# Patient Record
Sex: Female | Born: 1955 | Race: White | Hispanic: No
Health system: Southern US, Community
[De-identification: ages and names within clinical notes are randomized; demographics above are authoritative.]

---

## 2004-03-01 ENCOUNTER — Ambulatory Visit: Payer: Self-pay | Admitting: Otolaryngology

## 2004-05-20 ENCOUNTER — Ambulatory Visit: Payer: Self-pay | Admitting: Unknown Physician Specialty

## 2004-07-06 ENCOUNTER — Ambulatory Visit: Payer: Self-pay | Admitting: Obstetrics and Gynecology

## 2004-07-30 ENCOUNTER — Ambulatory Visit: Payer: Self-pay | Admitting: Obstetrics and Gynecology

## 2004-08-08 ENCOUNTER — Ambulatory Visit: Payer: Self-pay

## 2004-11-22 ENCOUNTER — Ambulatory Visit: Payer: Self-pay

## 2005-03-23 ENCOUNTER — Ambulatory Visit: Payer: Self-pay

## 2005-12-29 ENCOUNTER — Ambulatory Visit: Payer: Self-pay | Admitting: Obstetrics and Gynecology

## 2006-01-09 ENCOUNTER — Ambulatory Visit: Payer: Self-pay | Admitting: Obstetrics and Gynecology

## 2006-02-14 ENCOUNTER — Ambulatory Visit: Payer: Self-pay | Admitting: Internal Medicine

## 2006-03-01 ENCOUNTER — Ambulatory Visit: Payer: Self-pay | Admitting: Unknown Physician Specialty

## 2006-03-02 ENCOUNTER — Ambulatory Visit: Payer: Self-pay | Admitting: Internal Medicine

## 2006-03-21 ENCOUNTER — Ambulatory Visit: Payer: Self-pay | Admitting: Unknown Physician Specialty

## 2006-05-03 ENCOUNTER — Ambulatory Visit: Payer: Self-pay | Admitting: Urology

## 2006-09-12 ENCOUNTER — Ambulatory Visit: Payer: Self-pay | Admitting: Internal Medicine

## 2007-03-20 ENCOUNTER — Ambulatory Visit: Payer: Self-pay | Admitting: Obstetrics and Gynecology

## 2007-07-02 ENCOUNTER — Ambulatory Visit: Payer: Self-pay | Admitting: Family Medicine

## 2007-09-26 ENCOUNTER — Ambulatory Visit: Payer: Self-pay

## 2007-10-01 ENCOUNTER — Ambulatory Visit: Payer: Self-pay

## 2007-10-31 ENCOUNTER — Ambulatory Visit: Payer: Self-pay

## 2008-04-06 ENCOUNTER — Ambulatory Visit: Payer: Self-pay | Admitting: Internal Medicine

## 2008-04-30 ENCOUNTER — Ambulatory Visit: Payer: Self-pay | Admitting: Internal Medicine

## 2008-10-07 ENCOUNTER — Ambulatory Visit: Payer: Self-pay | Admitting: Unknown Physician Specialty

## 2008-12-23 ENCOUNTER — Ambulatory Visit: Payer: Self-pay | Admitting: Unknown Physician Specialty

## 2009-01-19 ENCOUNTER — Ambulatory Visit: Payer: Self-pay | Admitting: Family Medicine

## 2009-12-28 ENCOUNTER — Ambulatory Visit: Payer: Self-pay | Admitting: Unknown Physician Specialty

## 2010-01-18 ENCOUNTER — Ambulatory Visit: Payer: Self-pay | Admitting: Internal Medicine

## 2010-01-30 ENCOUNTER — Ambulatory Visit: Payer: Self-pay | Admitting: Internal Medicine

## 2010-02-03 ENCOUNTER — Ambulatory Visit: Payer: Self-pay | Admitting: Internal Medicine

## 2010-10-17 ENCOUNTER — Ambulatory Visit: Payer: Self-pay | Admitting: Unknown Physician Specialty

## 2011-03-15 ENCOUNTER — Ambulatory Visit: Payer: Self-pay | Admitting: Obstetrics and Gynecology

## 2011-03-23 ENCOUNTER — Ambulatory Visit: Payer: Self-pay | Admitting: Obstetrics and Gynecology

## 2012-05-23 ENCOUNTER — Ambulatory Visit: Payer: Self-pay | Admitting: Obstetrics and Gynecology

## 2013-01-27 ENCOUNTER — Ambulatory Visit: Payer: Self-pay | Admitting: Obstetrics and Gynecology

## 2013-02-10 ENCOUNTER — Ambulatory Visit: Payer: Self-pay | Admitting: Obstetrics and Gynecology

## 2013-02-11 LAB — BASIC METABOLIC PANEL
ANION GAP: 6 — AB (ref 7–16)
BUN: 10 mg/dL (ref 7–18)
CALCIUM: 8.5 mg/dL (ref 8.5–10.1)
Chloride: 99 mmol/L (ref 98–107)
Co2: 26 mmol/L (ref 21–32)
Creatinine: 0.71 mg/dL (ref 0.60–1.30)
EGFR (African American): >60
EGFR (Non-African Amer.): >60
Glucose: 202 mg/dL — ABNORMAL HIGH (ref 65–99)
Osmolality: 267 (ref 275–301)
Potassium: 3.4 mmol/L — ABNORMAL LOW (ref 3.5–5.1)
Sodium: 131 mmol/L — ABNORMAL LOW (ref 136–145)

## 2013-02-11 LAB — HEMATOCRIT: HCT: 36.7 % (ref 35.0–47.0)

## 2013-02-13 LAB — PATHOLOGY REPORT

## 2013-05-07 ENCOUNTER — Ambulatory Visit: Payer: Self-pay | Admitting: Internal Medicine

## 2013-05-27 ENCOUNTER — Ambulatory Visit: Payer: Self-pay | Admitting: Obstetrics and Gynecology

## 2013-05-30 ENCOUNTER — Ambulatory Visit: Payer: Self-pay | Admitting: Internal Medicine

## 2013-06-30 ENCOUNTER — Ambulatory Visit: Payer: Self-pay | Admitting: Internal Medicine

## 2014-03-13 ENCOUNTER — Ambulatory Visit: Payer: Self-pay | Admitting: Neurology

## 2014-05-23 NOTE — Op Note (Signed)
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   POSTOPERATIVE DIAGNOSES:    PROCEDURE:  1.  Total vaginal hysterectomy.  2.  Anterior colporrhaphy.  3.  Posterior colporrhaphy.   ANESTHESIA:  General endotracheal anesthesia.    SURGEON: Suzy Bouchardhomas J. Schermerhorn, M.D.   FIRST ASSISTANT: Logan BoresEvans.   INDICATIONS: This is a [age] year old gravida 2, para 2 patient with second-degree uterine descensus, third-degree cystocele and a second-degree rectocele. The patient elects for surgical correction.   DESCRIPTION OF PROCEDURE: After general endotracheal anesthesia, the patient was placed in the dorsal supine position with legs in the candy-cane stirrups. Lower abdomen and vaginal prep was performed with Betadine. The patient previously received 2 grams of IV cefoxitin prior to commencement of the case. A weighted speculum was placed in the posterior vaginal vault and the cervix was grasped with a thyroid tenaculum. Circumferential injection of the cervix with 0.5% lidocaine with 1:100,000 epinephrine.   A direct posterior colpotomy incision was made. Entrance into the posterior cul-de-sac was accomplished without difficulty. A long-bill weighted speculum was placed in the posterior cul-de-sac. The uterosacral ligaments were bilaterally clamped, transected, suture ligated with 0 Vicryl suture and tagged for later identification. Anterior cervix was circumferentially incised with the Bovie. The cardinal ligaments were then bilaterally clamped, transected and suture ligated with 0 Vicryl suture. The anterior cul-de-sac was then entered without difficulty. A Deaver retractor was placed within the anterior cul-de-sac to reflect the bladder anteriorly. Uterine arteries were bilaterally clamped, transected and suture ligated with 0 Vicryl suture. Cornua were then  bilaterally clamped, transected and doubly ligated with 0 Vicryl suture. The specimen was delivered off the operative field. Ovaries appeared normal but were high in the vault; therefore, they were not removed. Good hemostasis was noted. The peritoneum was then closed with a pursestring 2-0 PDS suture.   Attention was then directed to the anterior vagina which was grasped in the midportion and injected with 0.5% lidocaine with epinephrine. Vaginal vault anteriorly was opened with Metzenbaum scissors to 1.5 cm away from the urethra. The bladder was dissected off the vaginal epithelium until healthy endopelvic fascia was identified. The defect was closed with interrupted mattress sutures of 2-0 Vicryl suture. Vaginal epithelium was trimmed. A similar procedure was repeated in the posterior vaginal vault.  At the hymenal ring a wedge shape incision was made and the central portion of the posterior vaginal epithelium was injected with 0.5% lidocaine with epinephrine. Posterior vaginal vault was opened with Metzenbaum scissors and the rectocele was dissected free from the vaginal epithelium. Good endopelvic fascia was identified and the defect was closed with interrupted 2-0 Vicryl mattress sutures. The vaginal epithelium was then trimmed. The total vaginal cuff was then closed with 0 Vicryl suture in a running locking fashion. The uterosacral ligaments were plicated centrally and the rest of the posterior vaginal epithelium was closed. Good hemostasis was noted.   Vagina was then packed with Kerlix roll with AVC cream. Foley was placed, additional 50 mL of urine was received after placement of the Foley. Of note, the patient's bladder was catheterized prior to commencement of the case with a red Robinson catheter yielding 50 mL clear urine. There were no complications. The patient tolerated the procedure well. Estimated blood loss 50 mL, urine output 100 mL and intraoperative fluids 1000 mL.    ____________________________ Suzy Bouchardhomas J. Schermerhorn, MD  tjs:cs D: 02/10/2013 09:51:12 ET T: 02/10/2013 15:00:42 ET JOB#: [id number]  cc: Suzy Bouchard, MD, <Dictator> Suzy Bouchard MD ELECTRONICALLY SIGNED 02/14/2013 8:36

## 2014-05-27 ENCOUNTER — Other Ambulatory Visit: Payer: Self-pay | Admitting: Nurse Practitioner

## 2014-05-27 DIAGNOSIS — R7401 Elevation of levels of liver transaminase levels: Secondary | ICD-10-CM

## 2014-05-27 DIAGNOSIS — K760 Fatty (change of) liver, not elsewhere classified: Secondary | ICD-10-CM

## 2014-05-27 DIAGNOSIS — R740 Nonspecific elevation of levels of transaminase and lactic acid dehydrogenase [LDH]: Principal | ICD-10-CM

## 2014-06-03 ENCOUNTER — Ambulatory Visit
Admission: RE | Admit: 2014-06-03 | Discharge: 2014-06-03 | Disposition: A | Discharge status: Home / Self Care | Payer: BLUE CROSS/BLUE SHIELD | Source: Ambulatory Visit | Attending: Nurse Practitioner | Admitting: Nurse Practitioner

## 2014-06-03 ENCOUNTER — Other Ambulatory Visit: Payer: Self-pay | Admitting: Internal Medicine

## 2014-06-03 DIAGNOSIS — K760 Fatty (change of) liver, not elsewhere classified: Secondary | ICD-10-CM | POA: Insufficient documentation

## 2014-06-03 DIAGNOSIS — R740 Nonspecific elevation of levels of transaminase and lactic acid dehydrogenase [LDH]: Secondary | ICD-10-CM | POA: Insufficient documentation

## 2014-06-03 DIAGNOSIS — Z1231 Encounter for screening mammogram for malignant neoplasm of breast: Secondary | ICD-10-CM

## 2014-06-03 DIAGNOSIS — R7401 Elevation of levels of liver transaminase levels: Secondary | ICD-10-CM

## 2014-06-22 ENCOUNTER — Ambulatory Visit: Payer: BLUE CROSS/BLUE SHIELD | Attending: Internal Medicine

## 2014-08-19 ENCOUNTER — Other Ambulatory Visit: Payer: Self-pay | Admitting: Unknown Physician Specialty

## 2014-08-19 DIAGNOSIS — M25561 Pain in right knee: Secondary | ICD-10-CM

## 2014-08-19 DIAGNOSIS — S8990XS Unspecified injury of unspecified lower leg, sequela: Secondary | ICD-10-CM

## 2014-08-19 DIAGNOSIS — M25562 Pain in left knee: Principal | ICD-10-CM

## 2014-08-20 ENCOUNTER — Other Ambulatory Visit: Payer: Self-pay | Admitting: Unknown Physician Specialty

## 2014-08-20 DIAGNOSIS — S8990XS Unspecified injury of unspecified lower leg, sequela: Secondary | ICD-10-CM

## 2014-08-20 DIAGNOSIS — M25561 Pain in right knee: Secondary | ICD-10-CM

## 2014-08-20 DIAGNOSIS — M25562 Pain in left knee: Principal | ICD-10-CM

## 2014-08-25 ENCOUNTER — Ambulatory Visit: Payer: BLUE CROSS/BLUE SHIELD

## 2014-08-26 ENCOUNTER — Ambulatory Visit
Admission: RE | Admit: 2014-08-26 | Discharge: 2014-08-26 | Disposition: A | Discharge status: Home / Self Care | Payer: BLUE CROSS/BLUE SHIELD | Source: Ambulatory Visit | Attending: Unknown Physician Specialty | Admitting: Unknown Physician Specialty

## 2014-08-26 DIAGNOSIS — M25562 Pain in left knee: Secondary | ICD-10-CM

## 2014-08-26 DIAGNOSIS — S8990XS Unspecified injury of unspecified lower leg, sequela: Secondary | ICD-10-CM | POA: Insufficient documentation

## 2014-08-26 DIAGNOSIS — X58XXXS Exposure to other specified factors, sequela: Secondary | ICD-10-CM | POA: Insufficient documentation

## 2014-08-26 DIAGNOSIS — M25561 Pain in right knee: Secondary | ICD-10-CM | POA: Diagnosis present

## 2014-08-26 DIAGNOSIS — M9420 Chondromalacia, unspecified site: Secondary | ICD-10-CM | POA: Insufficient documentation

## 2014-08-26 DIAGNOSIS — G8929 Other chronic pain: Secondary | ICD-10-CM | POA: Diagnosis present

## 2015-06-30 ENCOUNTER — Ambulatory Visit
Admission: RE | Admit: 2015-06-30 | Discharge: 2015-06-30 | Disposition: A | Discharge status: Home / Self Care | Payer: Managed Care, Other (non HMO) | Source: Ambulatory Visit | Attending: Internal Medicine | Admitting: Internal Medicine

## 2015-06-30 DIAGNOSIS — Z1231 Encounter for screening mammogram for malignant neoplasm of breast: Secondary | ICD-10-CM | POA: Insufficient documentation

## 2017-01-01 IMAGING — MG MM DIGITAL SCREENING BILATERAL
6 series · 6 of 6 positions shown · non-contrast
Comparison: Previous exam(s).

CLINICAL DATA: Screening.

EXAM:
DIGITAL SCREENING BILATERAL MAMMOGRAM WITH CAD

[L MLO (1 of 2)]
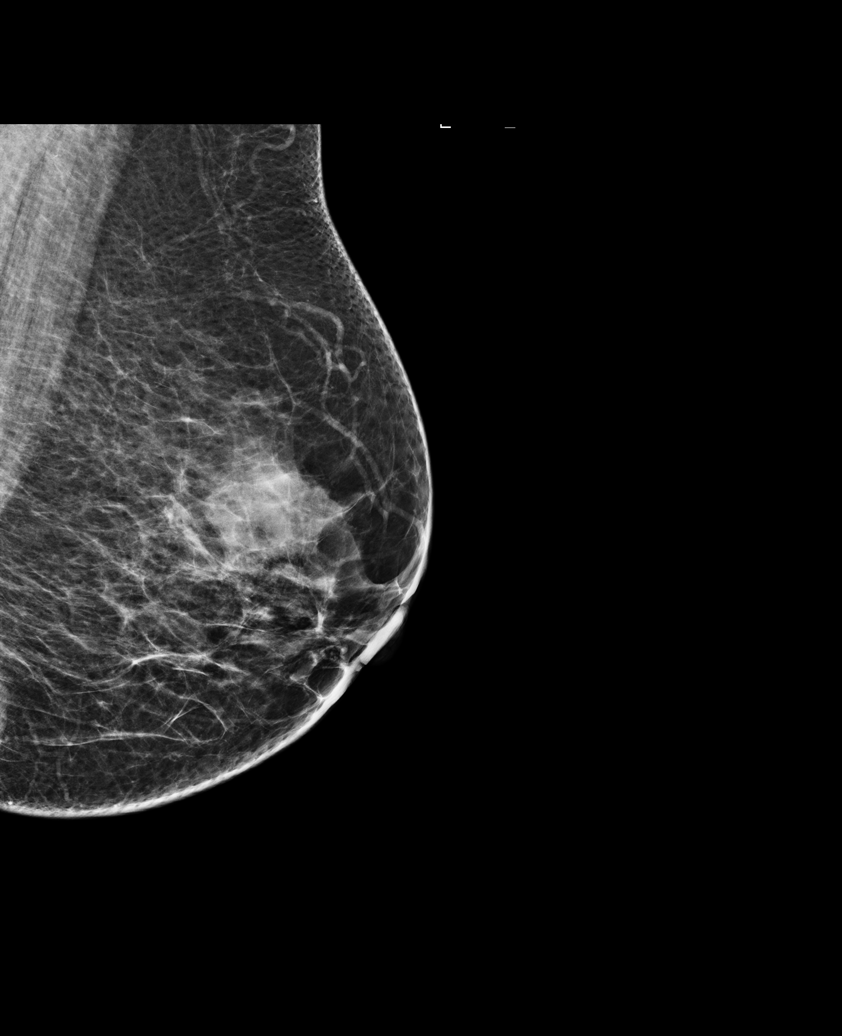

[L CC]
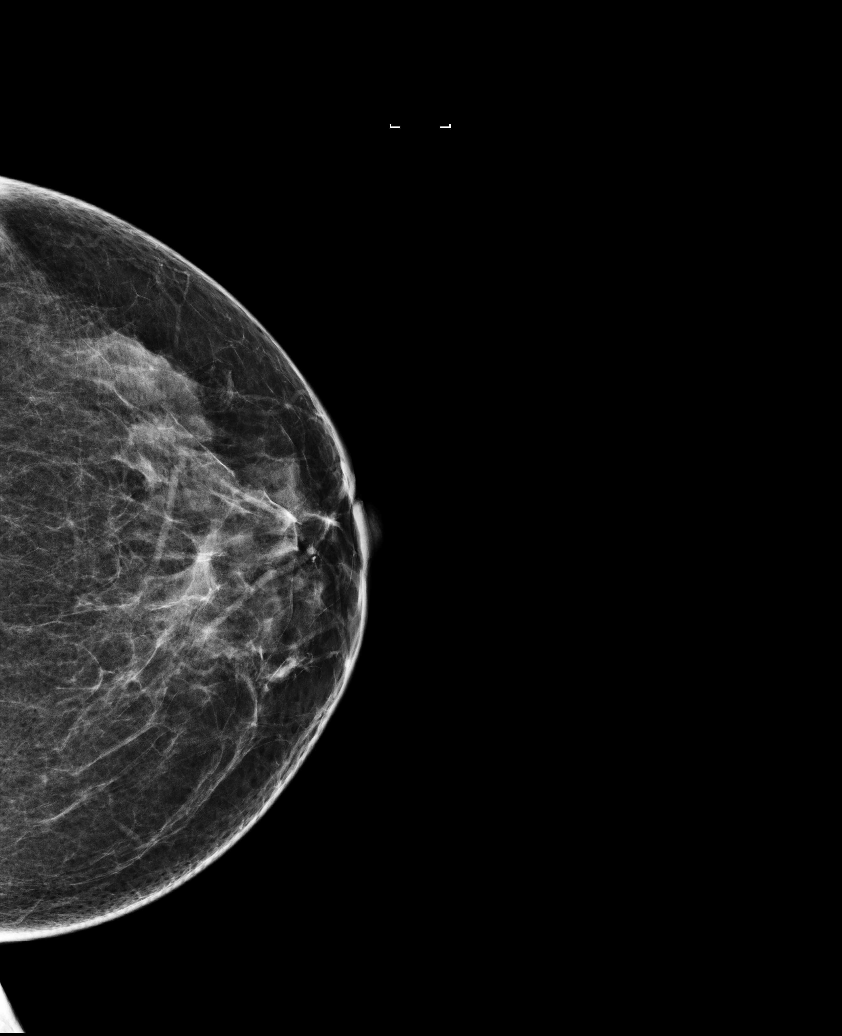

[R CC]
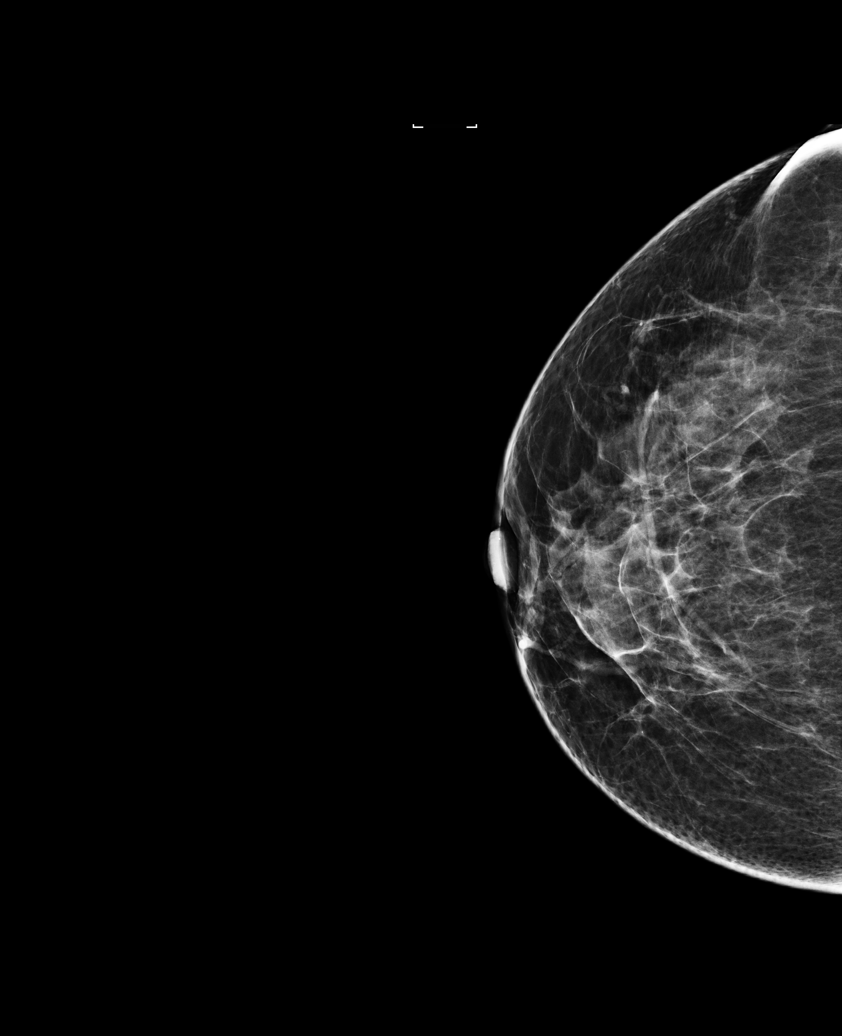

[L MLO (2 of 2)]
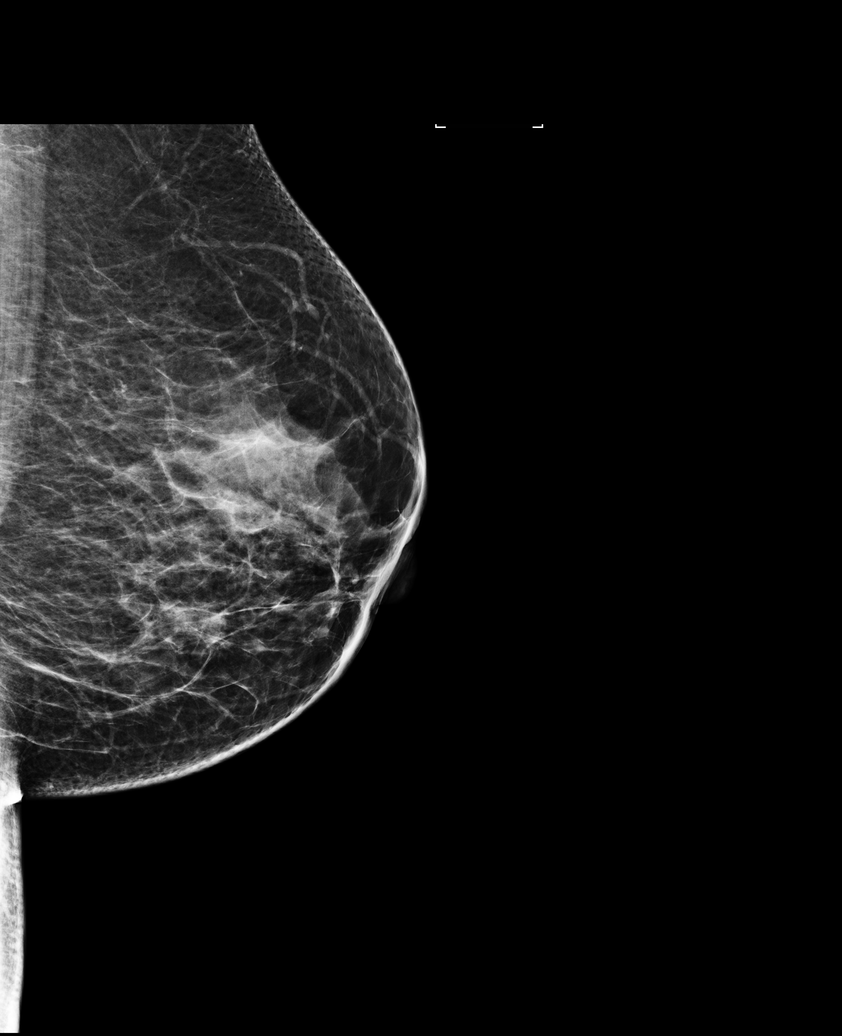

[R MLO (1 of 2)]
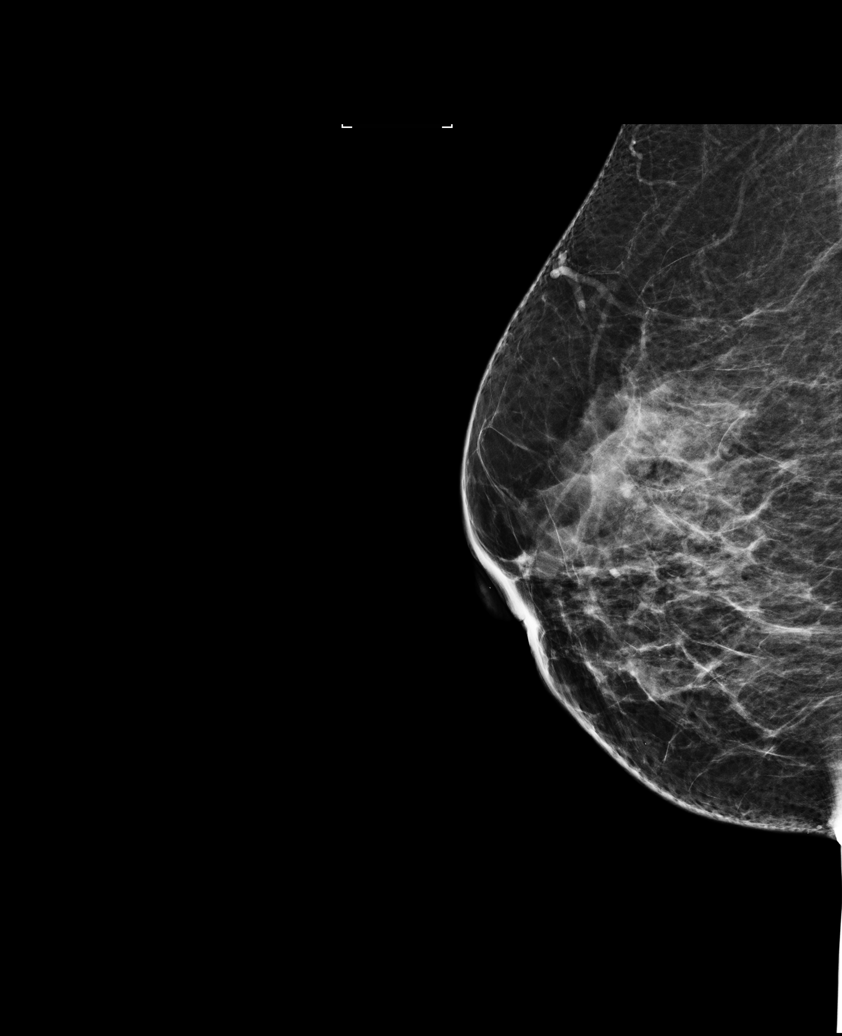

[R MLO (2 of 2)]
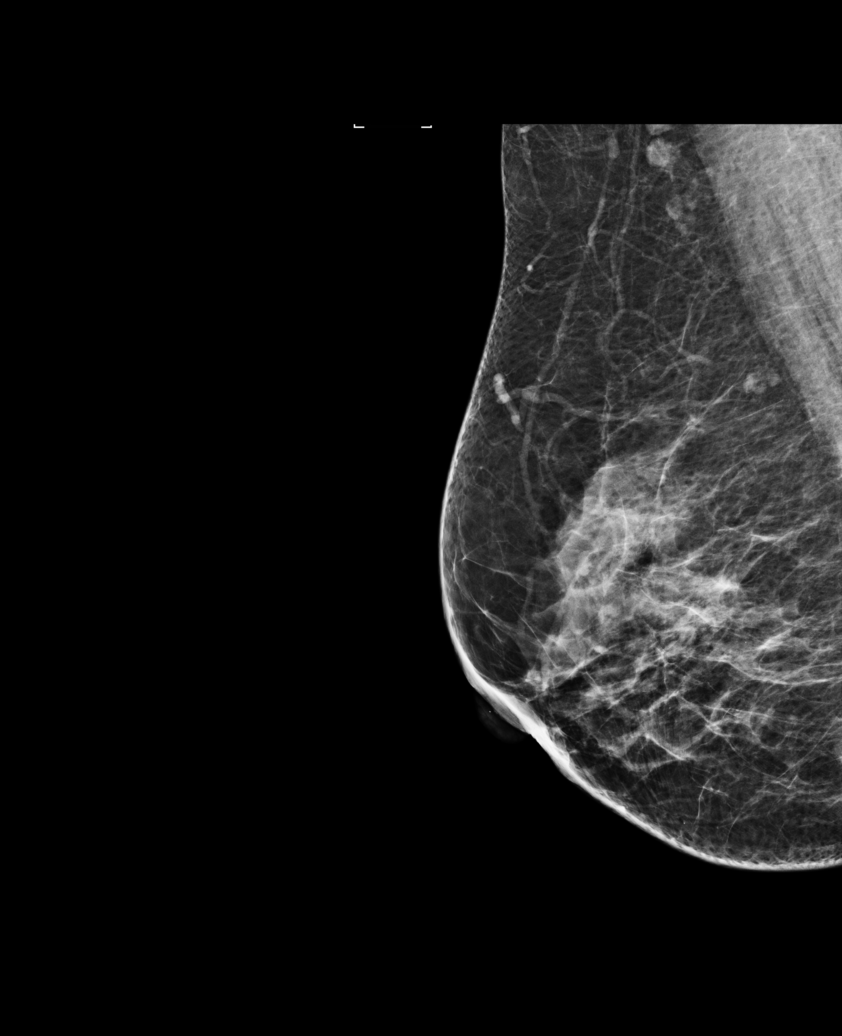

[6 of 6 positions shown; findings below may reference images not displayed]

ACR Breast Density Category b: There are scattered areas of
fibroglandular density.
FINDINGS: There are no findings suspicious for malignancy. Images were
processed with CAD.
IMPRESSION: No mammographic evidence of malignancy. A result letter of this
screening mammogram will be mailed directly to the patient.

RECOMMENDATION:
Screening mammogram in one year. (Code:AS-G-LCT)

BI-RADS CATEGORY  1: Negative.

## 2017-07-13 ENCOUNTER — Other Ambulatory Visit: Payer: Self-pay | Admitting: Obstetrics and Gynecology

## 2017-07-13 DIAGNOSIS — Z1231 Encounter for screening mammogram for malignant neoplasm of breast: Secondary | ICD-10-CM

## 2017-07-23 ENCOUNTER — Ambulatory Visit (HOSPITAL_COMMUNITY)
Admission: RE | Admit: 2017-07-23 | Discharge: 2017-07-23 | Disposition: A | Discharge status: Home / Self Care | Payer: 59 | Source: Ambulatory Visit | Attending: Nurse Practitioner | Admitting: Nurse Practitioner

## 2017-07-23 ENCOUNTER — Encounter (HOSPITAL_COMMUNITY): Payer: Self-pay

## 2017-07-23 ENCOUNTER — Other Ambulatory Visit (HOSPITAL_COMMUNITY): Payer: Self-pay | Admitting: Nurse Practitioner

## 2017-07-23 ENCOUNTER — Other Ambulatory Visit: Payer: Self-pay | Admitting: Nurse Practitioner

## 2017-07-23 DIAGNOSIS — R413 Other amnesia: Secondary | ICD-10-CM

## 2017-07-25 ENCOUNTER — Ambulatory Visit (HOSPITAL_COMMUNITY)
Admission: RE | Admit: 2017-07-25 | Discharge: 2017-07-25 | Disposition: A | Discharge status: Home / Self Care | Payer: 59 | Source: Ambulatory Visit | Attending: Nurse Practitioner | Admitting: Nurse Practitioner

## 2017-07-25 DIAGNOSIS — R413 Other amnesia: Secondary | ICD-10-CM | POA: Diagnosis not present

## 2017-07-30 ENCOUNTER — Ambulatory Visit (HOSPITAL_COMMUNITY): Payer: BLUE CROSS/BLUE SHIELD

## 2017-08-08 ENCOUNTER — Ambulatory Visit
Admission: RE | Admit: 2017-08-08 | Discharge: 2017-08-08 | Disposition: A | Discharge status: Home / Self Care | Payer: 59 | Source: Ambulatory Visit | Attending: Obstetrics and Gynecology | Admitting: Obstetrics and Gynecology

## 2017-08-08 DIAGNOSIS — Z1231 Encounter for screening mammogram for malignant neoplasm of breast: Secondary | ICD-10-CM | POA: Diagnosis present

## 2017-08-31 ENCOUNTER — Other Ambulatory Visit
Admission: RE | Admit: 2017-08-31 | Discharge: 2017-08-31 | Disposition: A | Discharge status: Home / Self Care | Payer: 59 | Source: Ambulatory Visit | Attending: Nurse Practitioner | Admitting: Nurse Practitioner

## 2017-08-31 DIAGNOSIS — R197 Diarrhea, unspecified: Secondary | ICD-10-CM | POA: Insufficient documentation

## 2017-08-31 LAB — GASTROINTESTINAL PANEL BY PCR, STOOL (REPLACES STOOL CULTURE)

## 2017-08-31 LAB — C DIFFICILE QUICK SCREEN W PCR REFLEX
C Diff antigen: NEGATIVE
C Diff interpretation: NOT DETECTED
C Diff toxin: NEGATIVE

## 2017-09-14 ENCOUNTER — Encounter: Payer: Self-pay | Admitting: Psychology

## 2018-03-05 ENCOUNTER — Encounter

## 2018-03-05 ENCOUNTER — Encounter: Payer: 59 | Admitting: Psychology

## 2018-04-18 ENCOUNTER — Encounter: Payer: 59 | Admitting: Psychology

## 2018-09-06 ENCOUNTER — Ambulatory Visit
Admission: RE | Admit: 2018-09-06 | Discharge: 2018-09-06 | Disposition: A | Discharge status: Home / Self Care | Payer: Disability Insurance | Source: Ambulatory Visit | Attending: Pediatrics | Admitting: Pediatrics

## 2018-09-06 ENCOUNTER — Other Ambulatory Visit: Payer: Self-pay | Admitting: Pediatrics

## 2018-09-06 DIAGNOSIS — M25362 Other instability, left knee: Secondary | ICD-10-CM | POA: Insufficient documentation

## 2018-09-06 DIAGNOSIS — M2351 Chronic instability of knee, right knee: Secondary | ICD-10-CM

## 2020-03-10 IMAGING — CR LEFT KNEE - 1-2 VIEW
2 series · 2 of 2 positions shown · non-contrast
Comparison: None.

CLINICAL DATA: Bilateral knee instability

EXAM:
LEFT KNEE - 1-2 VIEW; RIGHT KNEE - 1-2 VIEW

[knee ap]
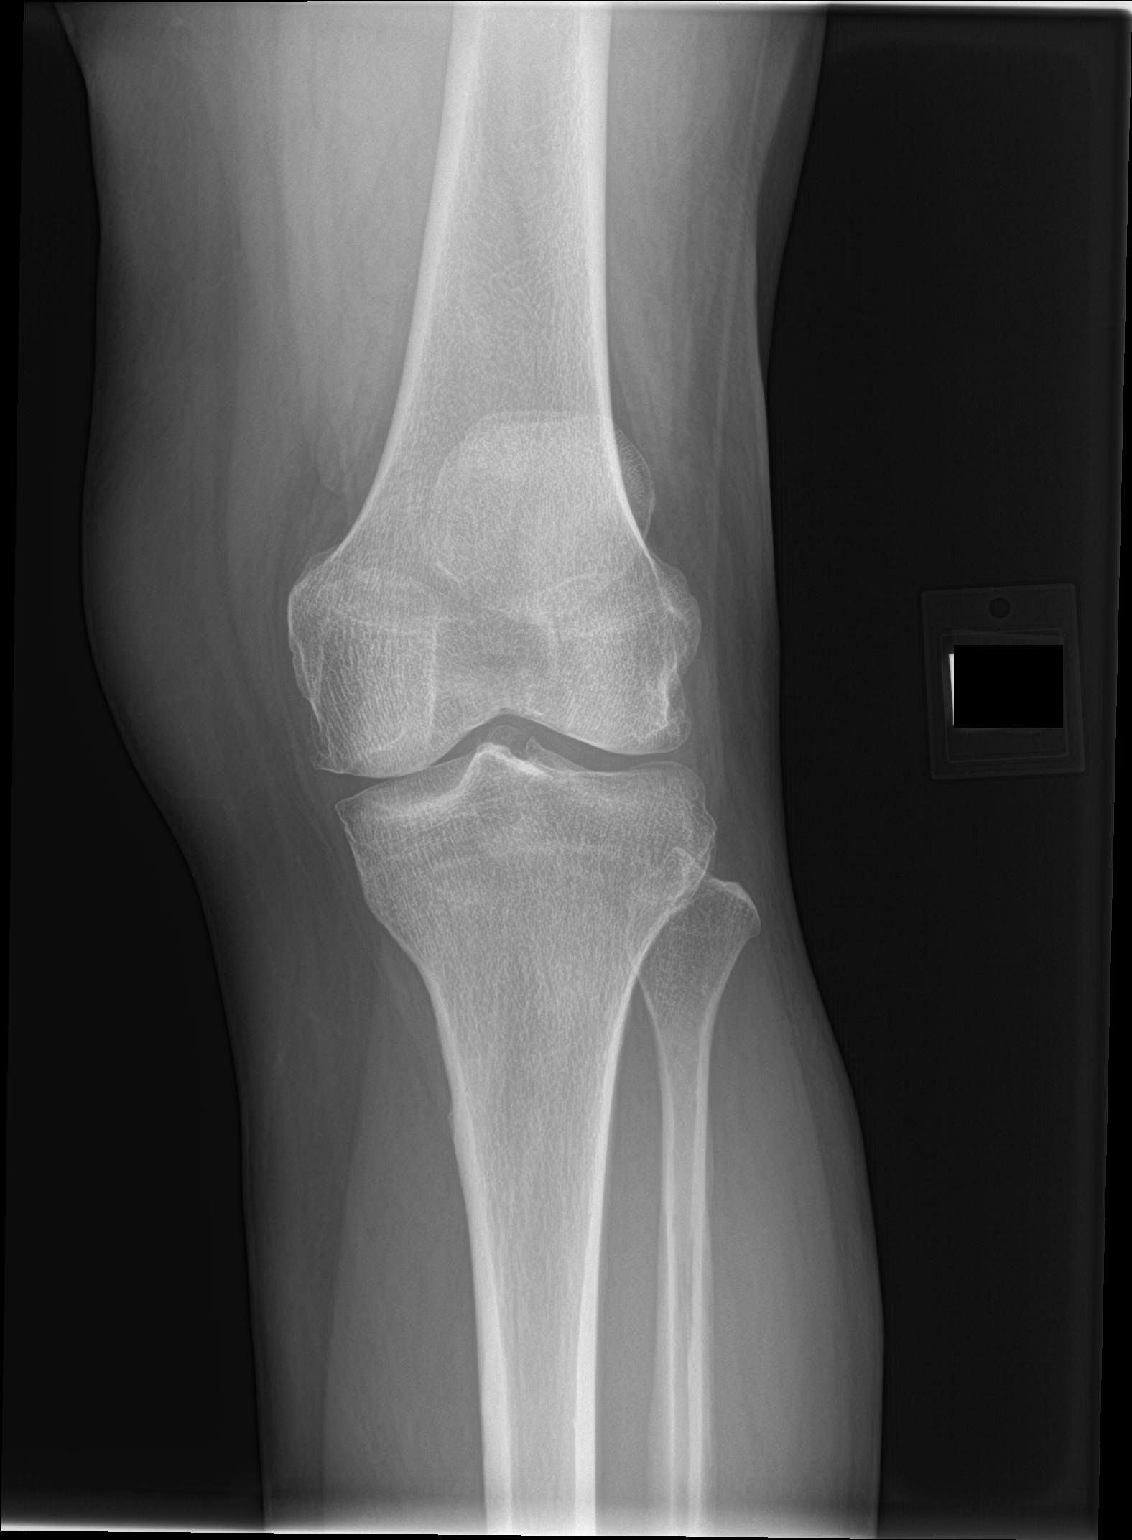

[knee lat]
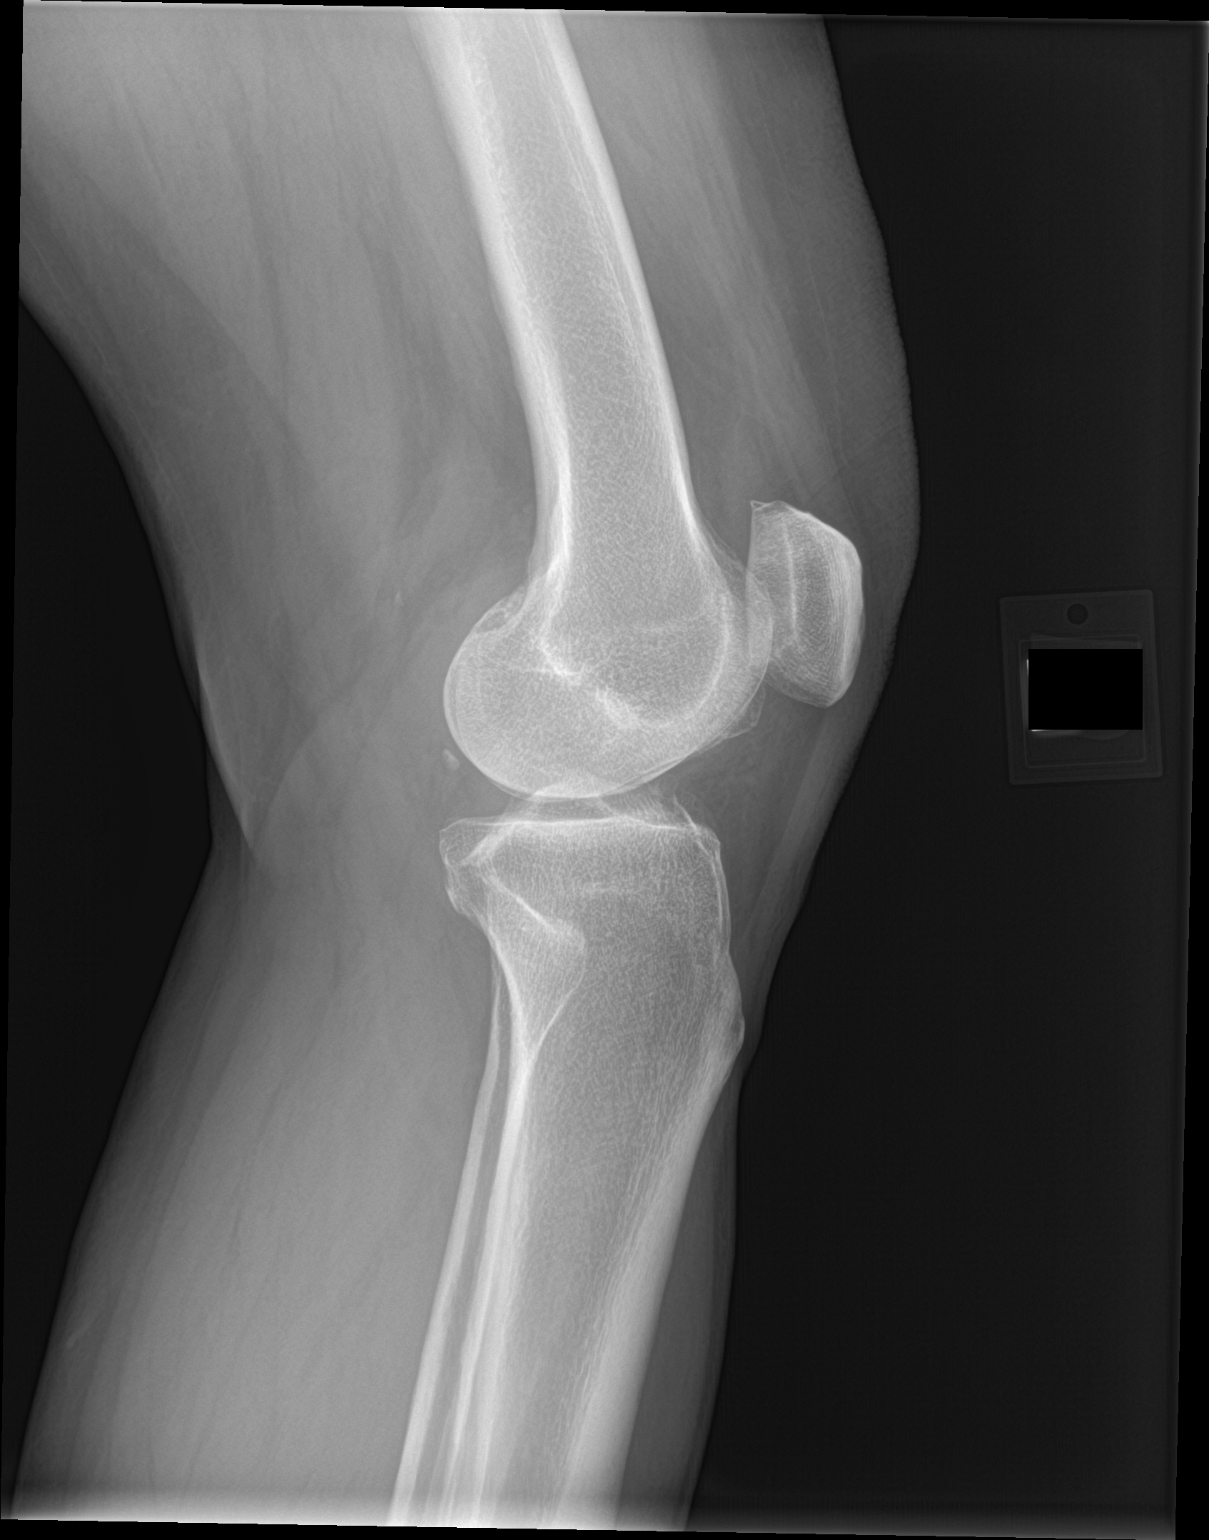

[2 of 2 positions shown; findings below may reference images not displayed]

FINDINGS: No fracture or dislocation of the bilateral knees. There is mild
tricompartmental joint space narrowing and osteophytosis, most
notable in the right medial and patellofemoral compartments. There
is a small, nonspecific left knee joint effusion without significant
right knee joint effusion. Soft tissues are unremarkable.
IMPRESSION: No fracture or dislocation of the bilateral knees. There is mild
tricompartmental joint space narrowing and osteophytosis, most
notable in the right medial and patellofemoral compartments. There
is a small, nonspecific left knee joint effusion without significant
right knee joint effusion. Soft tissues are unremarkable.
